# Patient Record
Sex: Female | Born: 1995 | Race: Black or African American | Hispanic: No | Marital: Single | Smoking: Never smoker
Health system: Southern US, Community
[De-identification: ages and names within clinical notes are randomized; demographics above are authoritative.]

## PROBLEM LIST (undated history)

## (undated) DIAGNOSIS — Z789 Other specified health status: Secondary | ICD-10-CM

## (undated) HISTORY — PX: NO PAST SURGERIES: SHX2092

## (undated) HISTORY — DX: Other specified health status: Z78.9

---
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
## 2018-04-27 ENCOUNTER — Other Ambulatory Visit: Payer: Self-pay

## 2018-04-27 ENCOUNTER — Ambulatory Visit (HOSPITAL_COMMUNITY)
Admission: EM | Admit: 2018-04-27 | Discharge: 2018-04-27 | Disposition: A | Discharge status: Home / Self Care | Attending: Family Medicine | Admitting: Family Medicine

## 2018-04-27 ENCOUNTER — Encounter (HOSPITAL_COMMUNITY): Payer: Self-pay | Admitting: Emergency Medicine

## 2018-04-27 DIAGNOSIS — K130 Diseases of lips: Secondary | ICD-10-CM

## 2018-04-27 DIAGNOSIS — Z113 Encounter for screening for infections with a predominantly sexual mode of transmission: Secondary | ICD-10-CM | POA: Insufficient documentation

## 2018-04-27 DIAGNOSIS — Z3202 Encounter for pregnancy test, result negative: Secondary | ICD-10-CM

## 2018-04-27 LAB — POCT URINALYSIS DIP (DEVICE)
GLUCOSE, UA: NEGATIVE mg/dL
Leukocytes, UA: NEGATIVE
Nitrite: NEGATIVE
PROTEIN: NEGATIVE mg/dL
Specific Gravity, Urine: 1.02 (ref 1.005–1.030)
UROBILINOGEN UA: 4 mg/dL — AB (ref 0.0–1.0)
pH: 7 (ref 5.0–8.0)

## 2018-04-27 LAB — POCT PREGNANCY, URINE: Preg Test, Ur: NEGATIVE

## 2018-04-27 NOTE — ED Triage Notes (Signed)
No abnormal discharge, no odor.  No pain with urination.  No abdominal pain, no back pain.

## 2018-04-27 NOTE — Discharge Instructions (Signed)
We are testing you for Herpes, HIV, Syphillis, Gonorrhea, Chlamydia, Trichomonas, Yeast and Bacterial Vaginosis. We will call you if anything is positive and let you know if you require any further treatment. Please inform partners of any positive results.   Please return if symptoms not improving with treatment, development of fever, nausea, vomiting, abdominal pain.

## 2018-04-28 LAB — CERVICOVAGINAL ANCILLARY ONLY
Bacterial vaginitis: POSITIVE — AB
Candida vaginitis: NEGATIVE
Chlamydia: NEGATIVE
Neisseria Gonorrhea: NEGATIVE
Trichomonas: NEGATIVE

## 2018-04-28 LAB — RPR: RPR Ser Ql: NONREACTIVE

## 2018-04-28 LAB — HIV ANTIBODY (ROUTINE TESTING W REFLEX): HIV Screen 4th Generation wRfx: NONREACTIVE

## 2018-04-28 NOTE — ED Provider Notes (Signed)
[facility]    CSN: [id number] Arrival date & time: 04/27/18  1715     History   Chief Complaint Chief Complaint  Patient presents with  . SEXUALLY TRANSMITTED DISEASE    HPI Jeanne Chapman is a 22 y.o. female no significant past medical history presenting today for STD screening.  Patient is concerned that she has noticed a bump on her upper lip that is been present for approximately 1 week.  She denies associated pain.  She is concerned that this may be herpes.  She denies other concerns, denies vaginal discharge, dysuria, increased frequency.  Denies abdominal pain, nausea or vomiting.  Denies lesions in genital area.  Denies known exposures to herpes.  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  Patient is sexually active.  Last menstrual period ended 10/31.  Patient is on oral contraceptives for birth control.  HPI  History reviewed. No pertinent past medical history.  There are no active problems to display for this patient.   History reviewed. No pertinent surgical history.  OB History   None      Home Medications    Prior to Admission medications   Medication Sig Start Date End Date Taking? Authorizing Provider  NON FORMULARY    Yes [provider]    Family History Family History  Problem Relation Age of Onset  . Hypertension Mother     Social History Social History   Tobacco Use  . Smoking status: Never Smoker  Substance Use Topics  . Alcohol use: Yes  . Drug use: Never     Allergies   Patient has no known allergies.   Review of Systems Review of Systems  Constitutional: Negative for fever.  HENT: Positive for mouth sores.   Respiratory: Negative for shortness of breath.   Cardiovascular: Negative for chest pain.  Gastrointestinal: Negative for abdominal pain, diarrhea, nausea and vomiting.  Genitourinary: Negative for dysuria, flank  pain, genital sores, hematuria, menstrual problem, vaginal bleeding, vaginal discharge and vaginal pain.  Musculoskeletal: Negative for back pain.  Skin: Negative for rash.  Neurological: Negative for dizziness, light-headedness and headaches.     Physical Exam Triage Vital Signs ED Triage Vitals  Enc Vitals Group     BP 04/27/18 1832 139/76     Pulse Rate 04/27/18 1832 97     Resp 04/27/18 1832 18     Temp 04/27/18 1832 98.8 F (37.1 C)     Temp Source 04/27/18 1832 Oral     SpO2 04/27/18 1832 100 %     Weight --      Height --      Head Circumference --      Peak Flow --      Pain Score 04/27/18 1830 0     Pain Loc --      Pain Edu? --      Excl. in GC? --    No data found.  Updated Vital Signs BP 139/76 (BP Location: Left Arm) Comment (BP Location): large cuff  Pulse 97   Temp 98.8 F (37.1 C) (Oral)   Resp 18   LMP 04/16/2018   SpO2 100%   Visual Acuity Right Eye Distance:   Left Eye Distance:   Bilateral Distance:    Right Eye Near:   Left Eye Near:    Bilateral Near:     Physical Exam  Constitutional: She appears  well-developed and well-nourished. No distress.  HENT:  Head: Normocephalic and atraumatic.  Small pimple-like lesion to central upper lip, nontender to palpation, slight erythema  Eyes: Conjunctivae are normal.  Neck: Neck supple.  Cardiovascular: Normal rate and regular rhythm.  No murmur heard. Pulmonary/Chest: Effort normal and breath sounds normal. No respiratory distress.  Abdominal: Soft. There is no tenderness.  Genitourinary:  Genitourinary Comments: Deferred  Musculoskeletal: She exhibits no edema.  Neurological: She is alert.  Skin: Skin is warm and dry.  Psychiatric: She has a normal mood and affect.  Nursing note and vitals reviewed.    UC Treatments / Results  Labs (all labs ordered are listed, but only abnormal results are displayed) Labs Reviewed  POCT URINALYSIS DIP (DEVICE) - Abnormal; Notable for the following  components:      Result Value   Bilirubin Urine SMALL (*)    Ketones, ur TRACE (*)    Hgb urine dipstick TRACE (*)    Urobilinogen, UA 4.0 (*)    All other components within normal limits  HSV CULTURE AND TYPING  HIV ANTIBODY (ROUTINE TESTING W REFLEX)  RPR  POCT PREGNANCY, URINE  CERVICOVAGINAL ANCILLARY ONLY    EKG None  Radiology No results found.  Procedures Procedures (including critical care time)  Medications Ordered in UC Medications - No data to display  Initial Impression / Assessment and Plan / UC Course  I have reviewed the triage vital signs and the nursing notes.  Pertinent labs & imaging results that were available during my care of the patient were reviewed by me and considered in my medical decision making (see chart for details).     Lesion on lip not concerning for herpes.  Discussed with this with patient, swab obtained to confirm.  Vaginal swab obtained as well to check for other STDs as well as HIV and syphilis.  Patient was emotional during the visit and all questions were answered.  Patient was attempted to console and reassure.  Will call patient with results and provide any treatment if needed. Final Clinical Impressions(s) / UC Diagnoses   Final diagnoses:  Screen for STD (sexually transmitted disease)     Discharge Instructions     We are testing you for Herpes, HIV, Syphillis, Gonorrhea, Chlamydia, Trichomonas, Yeast and Bacterial Vaginosis. We will call you if anything is positive and let you know if you require any further treatment. Please inform partners of any positive results.   Please return if symptoms not improving with treatment, development of fever, nausea, vomiting, abdominal pain.    ED Prescriptions    None     Controlled Substance Prescriptions [place] Controlled Substance Registry consulted? Not Applicable   Lew DawesWieters, Hallie C, [place]PA-C 04/28/18 1636

## 2018-04-30 ENCOUNTER — Telehealth (HOSPITAL_COMMUNITY): Payer: Self-pay

## 2018-04-30 MED ORDER — METRONIDAZOLE 500 MG PO TABS: 500.0000 mg | ORAL_TABLET | Freq: Two times a day (BID) | ORAL | 0 refills | Status: DC

## 2018-04-30 NOTE — Telephone Encounter (Signed)
Bacterial vaginosis is positive. This was not treated at the urgent care visit.  Flagyl 500 mg BID x 7 days #14 no refills sent to patients pharmacy of choice.  Patient contacted, denies any concerns. Answered all questions.    

## 2018-05-01 LAB — HSV CULTURE AND TYPING

## 2018-05-28 ENCOUNTER — Ambulatory Visit (INDEPENDENT_AMBULATORY_CARE_PROVIDER_SITE_OTHER): Payer: Self-pay | Admitting: Family Medicine

## 2018-05-28 ENCOUNTER — Encounter: Payer: Self-pay | Admitting: Family Medicine

## 2018-05-28 VITALS — BP 141/73 | HR 94 | Ht 65.0 in | Wt 252.0 lb

## 2018-05-28 DIAGNOSIS — N9089 Other specified noninflammatory disorders of vulva and perineum: Secondary | ICD-10-CM

## 2018-05-28 DIAGNOSIS — Z113 Encounter for screening for infections with a predominantly sexual mode of transmission: Secondary | ICD-10-CM

## 2018-05-28 DIAGNOSIS — N898 Other specified noninflammatory disorders of vagina: Secondary | ICD-10-CM

## 2018-05-28 NOTE — Patient Instructions (Signed)
   Sign up for MyChart  I will call you with your results  Use vaseline on labial lesion

## 2018-05-28 NOTE — Progress Notes (Signed)
   GYNECOLOGY OFFICE VISIT NOTE History:  22 y.o. G0P0000 here today for bleeding. States she went to wipe today and noticed small amount of blood on the tissue. States not from rectum area. Very small amount and not like period. Finished period somewhat recently. States had intercourse 04/16/18 with a new partner. Had not had intercourse prior for about 2 years. States thinks she had a small tear related to intercourse. Said she had been drinking and didn't plan on having intercourse but it just happened. Went to urgent care for testing about 1-2 weeks later and everything was negative. Has been feeling very stressed and upset about encounter. Is scared that she has contracted an STI. Was treated for BV and then yeast infection from urgent care. States she's never had either before. Does wax monthly.   Also wondering about bump on lips. Was previously tested for HSV at urgent care. Comes/goes.   The following portions of the patient's history were reviewed and updated as appropriate: allergies, current medications, past family history, past medical history, past social history, past surgical history and problem list.   Review of Systems:  Pertinent items noted in HPI ROS  Objective:  Physical Exam BP (!) 141/73   Pulse 94   Ht 5\' 5"  (1.651 m)   Wt 252 lb (114.3 kg)   LMP 05/18/2018 (Approximate)   BMI 41.93 kg/m  Physical Exam  Constitutional: She is oriented to person, place, and time. She appears well-developed and well-nourished.  tearful  HENT:  Head: Normocephalic and atraumatic.  Eyes: Conjunctivae and EOM are normal.  Cardiovascular: Normal rate.  Pulmonary/Chest: No respiratory distress.  Genitourinary:    Genitourinary Comments: External labia on left with small linear abrasion/ulceration (about 2-673mm in length)  normal appearing vagina with minimal white, thin discharge, normal appearing cervix  no CMT  rectum without hemorrhoids or fissure    Neurological: She is alert  and oriented to person, place, and time.  Skin: Skin is warm and dry.  Psychiatric: She has a normal mood and affect. Her behavior is normal.  Nursing note and vitals reviewed.  Labs and Imaging No results found for this or any previous visit (from the past 168 hour(s)). No results found.  Assessment & Plan:   Jeanne Reichmann22yo G0 who presents because of bleeding with wiping and concern for STI. No new partners since last intercourse 04/16/18. Was negative for testing nearly 2 weeks after that encounter. Will repeat testing today. Lesion on labia more consistent with small abrasion/tear that has not fully healed. Recommended application of vaseline and avoiding irritating tissue, excessive scrubbing. HSV culture collected. Repeat STI testing. Offered refill of OCPs but patient reports has at home. Counseled on vaginal hygiene. Reviewed return precautions and recommended setting up MyChart for correspondence. Reassurance provided.   Cristal DeerLaurel S. Earlene PlaterWallace, DO OB Family Medicine Fellow, [facility], [facility]

## 2018-05-29 LAB — RPR: RPR: NONREACTIVE

## 2018-05-29 LAB — HIV ANTIBODY (ROUTINE TESTING W REFLEX): HIV Screen 4th Generation wRfx: NONREACTIVE

## 2018-05-31 LAB — HERPES SIMPLEX VIRUS CULTURE

## 2018-06-02 LAB — CERVICOVAGINAL ANCILLARY ONLY
BACTERIAL VAGINITIS: NEGATIVE
Candida vaginitis: NEGATIVE
Chlamydia: NEGATIVE
Neisseria Gonorrhea: NEGATIVE
Trichomonas: NEGATIVE

## 2018-06-25 ENCOUNTER — Ambulatory Visit (INDEPENDENT_AMBULATORY_CARE_PROVIDER_SITE_OTHER): Payer: Self-pay | Admitting: Family Medicine

## 2018-06-25 ENCOUNTER — Encounter: Payer: Self-pay | Admitting: Family Medicine

## 2018-06-25 ENCOUNTER — Telehealth: Payer: Self-pay | Admitting: Family Medicine

## 2018-06-25 VITALS — BP 142/78 | HR 91 | Ht 65.0 in | Wt 252.9 lb

## 2018-06-25 DIAGNOSIS — K130 Diseases of lips: Secondary | ICD-10-CM

## 2018-06-25 NOTE — Telephone Encounter (Signed)
Called to make an appointment. Requested to be seen urgently due to issue. Stated she would like to continue to see Earlene Plater, APP going forward.

## 2018-06-25 NOTE — Progress Notes (Signed)
   Subjective:    Jeanne Chapman - 23 y.o. female MRN [id number]  Date of birth: 1996-01-04  HPI  Jeanne Chapman is a 23 y.o. G0P0000 female here for bump on upper lip. She states that the bump started in November. It has not gone away completely and come back since then. It does shrink smaller and get bigger, but she's not sure it has a specific pattern. Was seen for this previously, and had HSV testing on it, which she states was negative. Concerned that something is wrong, so she would like it removed.  OB History    Gravida  0   Para  0   Term  0   Preterm  0   AB  0   Living  0     SAB  0   TAB  0   Ectopic  0   Multiple  0   Live Births  0           Health Maintenance:   Health Maintenance Due  Topic Date Due  . TETANUS/TDAP  10/01/2016  . INFLUENZA VACCINE  01/15/2018    -  reports that she has never smoked. She has never used smokeless tobacco. - Review of Systems: Per HPI. - Past Medical History: Patient Active Problem List   Diagnosis Date Noted  . Dysmenorrhea in the adolescent 03/23/2013  . Insulin resistance 03/23/2013  . Obesity 03/23/2013   - Medications: reviewed and updated   Objective:   Physical Exam BP (!) 142/78   Pulse 91   Ht 5\' 5"  (1.651 m)   Wt 252 lb 14.4 oz (114.7 kg)   LMP 05/31/2018 (Within Days)   BMI 42.08 kg/m  Gen: tearful, alert, cooperative with exam, well-appearing HEENT: NCAT, PERRL, clear conjunctiva, 57mm diameter nodule on center of upper lip, no erythema, drainage, warmth or tenderness Resp: non-labored Skin: no rashes, normal turgor  Neuro: no gross deficits.  Psych: good insight, alert and oriented    Assessment & Plan:   1. Lesion of lip - benign appearing, however pt is anxious that something terrible is wrong - reassurance provided - Ambulatory referral to Dermatology for eval and treat   Routine preventative health maintenance measures emphasized. Please refer to After Visit Summary for other  counseling recommendations.   Return if symptoms worsen or fail to improve.  Gwenevere Abbot, MD  OB Fellow  06/25/2018, 8:07 PM

## 2020-09-03 ENCOUNTER — Encounter (HOSPITAL_BASED_OUTPATIENT_CLINIC_OR_DEPARTMENT_OTHER): Payer: Self-pay | Admitting: Emergency Medicine

## 2020-09-03 ENCOUNTER — Emergency Department (HOSPITAL_BASED_OUTPATIENT_CLINIC_OR_DEPARTMENT_OTHER)
Admission: EM | Admit: 2020-09-03 | Discharge: 2020-09-03 | Disposition: A | Discharge status: Home / Self Care | Attending: Emergency Medicine | Admitting: Emergency Medicine

## 2020-09-03 ENCOUNTER — Other Ambulatory Visit: Payer: Self-pay

## 2020-09-03 ENCOUNTER — Emergency Department (HOSPITAL_BASED_OUTPATIENT_CLINIC_OR_DEPARTMENT_OTHER)

## 2020-09-03 DIAGNOSIS — G43109 Migraine with aura, not intractable, without status migrainosus: Secondary | ICD-10-CM | POA: Diagnosis not present

## 2020-09-03 DIAGNOSIS — Z8669 Personal history of other diseases of the nervous system and sense organs: Secondary | ICD-10-CM | POA: Diagnosis not present

## 2020-09-03 DIAGNOSIS — R202 Paresthesia of skin: Secondary | ICD-10-CM | POA: Diagnosis not present

## 2020-09-03 DIAGNOSIS — R519 Headache, unspecified: Secondary | ICD-10-CM | POA: Diagnosis present

## 2020-09-03 LAB — BASIC METABOLIC PANEL
Anion gap: 13 (ref 5–15)
BUN: <5 mg/dL — ABNORMAL LOW (ref 6–20)
CO2: 20 mmol/L — ABNORMAL LOW (ref 22–32)
Calcium: 8.9 mg/dL (ref 8.9–10.3)
Chloride: 103 mmol/L (ref 98–111)
Creatinine, Ser: 0.68 mg/dL (ref 0.44–1.00)
GFR, Estimated: >60 mL/min (ref >60)
Glucose, Bld: 155 mg/dL — ABNORMAL HIGH (ref 70–99)
Potassium: 3.5 mmol/L (ref 3.5–5.1)
Sodium: 136 mmol/L (ref 135–145)

## 2020-09-03 LAB — CBC WITH DIFFERENTIAL/PLATELET
Abs Immature Granulocytes: 0.03 10*3/uL (ref 0.00–0.07)
Basophils Absolute: 0 10*3/uL (ref 0.0–0.1)
Basophils Relative: 0 %
Eosinophils Absolute: 0 10*3/uL (ref 0.0–0.5)
Eosinophils Relative: 0 %
HCT: 39 % (ref 36.0–46.0)
Hemoglobin: 12.9 g/dL (ref 12.0–15.0)
Immature Granulocytes: 0 %
Lymphocytes Relative: 17 %
Lymphs Abs: 1.6 10*3/uL (ref 0.7–4.0)
MCH: 26.8 pg (ref 26.0–34.0)
MCHC: 33.1 g/dL (ref 30.0–36.0)
MCV: 81.1 fL (ref 80.0–100.0)
Monocytes Absolute: 0.6 10*3/uL (ref 0.1–1.0)
Monocytes Relative: 6 %
Neutro Abs: 7.3 10*3/uL (ref 1.7–7.7)
Neutrophils Relative %: 77 %
Platelets: 306 10*3/uL (ref 150–400)
RBC: 4.81 MIL/uL (ref 3.87–5.11)
RDW: 13.1 % (ref 11.5–15.5)
WBC: 9.5 10*3/uL (ref 4.0–10.5)
nRBC: 0 % (ref 0.0–0.2)

## 2020-09-03 LAB — PREGNANCY, URINE: Preg Test, Ur: NEGATIVE

## 2020-09-03 MED ORDER — METOCLOPRAMIDE HCL 5 MG/ML IJ SOLN
10.0000 mg | Freq: Once | INTRAMUSCULAR | Status: AC
  Administered 2020-09-03: 10 mg via INTRAVENOUS
  Filled 2020-09-03: qty 2

## 2020-09-03 MED ORDER — SODIUM CHLORIDE 0.9 % IV BOLUS
1000.0000 mL | Freq: Once | INTRAVENOUS | Status: AC
  Administered 2020-09-03: 1000 mL via INTRAVENOUS

## 2020-09-03 MED ORDER — DROPERIDOL 2.5 MG/ML IJ SOLN
1.2500 mg | Freq: Once | INTRAMUSCULAR | Status: AC
  Administered 2020-09-03: 1.25 mg via INTRAVENOUS
  Filled 2020-09-03: qty 2

## 2020-09-03 MED ORDER — DEXAMETHASONE SODIUM PHOSPHATE 10 MG/ML IJ SOLN
10.0000 mg | Freq: Once | INTRAMUSCULAR | Status: AC
  Administered 2020-09-03: 10 mg via INTRAVENOUS
  Filled 2020-09-03: qty 1

## 2020-09-03 MED ORDER — KETOROLAC TROMETHAMINE 15 MG/ML IJ SOLN
15.0000 mg | Freq: Once | INTRAMUSCULAR | Status: AC
  Administered 2020-09-03: 15 mg via INTRAVENOUS
  Filled 2020-09-03: qty 1

## 2020-09-03 NOTE — ED Notes (Signed)
Patient transported to CT 

## 2020-09-03 NOTE — ED Notes (Signed)
Arrived via [facility] with c/o migraine h/a. Vomited x 2

## 2020-09-03 NOTE — ED Triage Notes (Signed)
Pt arrives via EMS with c/o HA and N/V that started at 10 today. Pt endorses photosensitivity. AOx4, bilaterally equal. Pt took "liquid pain relief" at 11 today with no relief

## 2020-09-03 NOTE — ED Provider Notes (Signed)
[facility] EMERGENCY DEPARTMENT Provider Note   CSN: [id number] Arrival date & time: 09/03/20  1653     History Chief Complaint  Patient presents with  . Headache    Mareena Cavan is a 25 y.o. female.  The history is provided by the patient and medical records.  Headache  Allis Quirarte is a 25 y.o. female who presents to the Emergency Department complaining of HA. She presents the emergency department by EMS for evaluation of HA that started at 9am this morning when she woke up.  Pain is located throughout the left head.  She has associated numbness to right face, arm. She describes this is a migraine headache and does have a history of migraines. She has not had a migraine since last year. She states that symptoms are similar to prior migraines but more intense than prior headaches. She took Tylenol at home with no significant improvement in symptoms. She denies any fevers. She does have vomiting. No recent illnesses or injuries. She takes Lexapro. No additional medical problems.    Past Medical History:  Diagnosis Date  . Medical history non-contributory     Patient Active Problem List   Diagnosis Date Noted  . Dysmenorrhea in the adolescent 03/23/2013  . Insulin resistance 03/23/2013  . Obesity 03/23/2013    Past Surgical History:  Procedure Laterality Date  . NO PAST SURGERIES       OB History    Gravida  0   Para  0   Term  0   Preterm  0   AB  0   Living  0     SAB  0   IAB  0   Ectopic  0   Multiple  0   Live Births  0           Family History  Problem Relation Age of Onset  . Hypertension Mother     Social History   Tobacco Use  . Smoking status: Never Smoker  . Smokeless tobacco: Never Used  Vaping Use  . Vaping Use: Never used  Substance Use Topics  . Alcohol use: Not Currently  . Drug use: Never    Home Medications Prior to Admission medications   Medication Sig Start Date End Date Taking? Authorizing  Provider  NON FORMULARY     [provider]    Allergies    Patient has no known allergies.  Review of Systems   Review of Systems  Neurological: Positive for headaches.  All other systems reviewed and are negative.   Physical Exam Updated Vital Signs BP 103/64 (BP Location: Left Arm)   Pulse 73   Temp 97.8 F (36.6 C) (Oral)   Resp 18   Ht 5\' 4"  (1.626 m)   Wt 81.6 kg   LMP 08/30/2020   SpO2 100%   BMI 30.90 kg/m   Physical Exam Vitals and nursing note reviewed.  Constitutional:      Appearance: She is well-developed.  HENT:     Head: Normocephalic and atraumatic.  Cardiovascular:     Rate and Rhythm: Normal rate and regular rhythm.     Heart sounds: No murmur heard.   Pulmonary:     Effort: Pulmonary effort is normal. No respiratory distress.     Breath sounds: Normal breath sounds.  Abdominal:     Palpations: Abdomen is soft.     Tenderness: There is no abdominal tenderness. There is no guarding or rebound.  Musculoskeletal:  General: No tenderness.  Skin:    General: Skin is warm and dry.  Neurological:     Mental Status: She is alert and oriented to person, place, and time.     Comments: No asymmetry of facial movements. Sensation light touch intact throughout the face, bilateral upper extremities and bilateral lower extremities. Five out of five strength in all four extremities.  Psychiatric:        Behavior: Behavior normal.     ED Results / Procedures / Treatments   Labs (all labs ordered are listed, but only abnormal results are displayed) Labs Reviewed  BASIC METABOLIC PANEL - Abnormal; Notable for the following components:      Result Value   CO2 20 (*)    Glucose, Bld 155 (*)    BUN <5 (*)    All other components within normal limits  CBC WITH DIFFERENTIAL/PLATELET  PREGNANCY, URINE    EKG None  Radiology CT Head Wo Contrast  Result Date: 09/03/2020 CLINICAL DATA:  Pt arrives via EMS with c/o HA and N/V that  started at 10 today. Pt endorses photosensitivity. AOx4, bilaterally equal. Pt took "liquid pain relief" at 11 today with no relief EXAM: CT HEAD WITHOUT CONTRAST TECHNIQUE: Contiguous axial images were obtained from the base of the skull through the vertex without intravenous contrast. COMPARISON:  None. FINDINGS: Brain: No evidence of acute infarction, hemorrhage, hydrocephalus, extra-axial collection or mass lesion/mass effect. Vascular: No hyperdense vessel or unexpected calcification. Skull: Normal. Negative for fracture or focal lesion. Sinuses/Orbits: No acute finding. Other: None. IMPRESSION: No acute intracranial pathology. Electronically Signed   By: Emmaline Kluver M.D.   On: 09/03/2020 18:10    Procedures Procedures   Medications Ordered in ED Medications  sodium chloride 0.9 % bolus 1,000 mL (0 mLs Intravenous Stopped 09/03/20 1943)  metoCLOPramide (REGLAN) injection 10 mg (10 mg Intravenous Given 09/03/20 1747)  ketorolac (TORADOL) 15 MG/ML injection 15 mg (15 mg Intravenous Given 09/03/20 1946)  dexamethasone (DECADRON) injection 10 mg (10 mg Intravenous Given 09/03/20 1946)  droperidol (INAPSINE) 2.5 MG/ML injection 1.25 mg (1.25 mg Intravenous Given 09/03/20 2034)    ED Course  I have reviewed the triage vital signs and the nursing notes.  Pertinent labs & imaging results that were available during my care of the patient were reviewed by me and considered in my medical decision making (see chart for details).    MDM Rules/Calculators/A&P                         Patient with history of migraine headache here for evaluation of headache similar but more intense than prior headaches. She reports paresthesias to the face and arm. On examination there are no focal deficits. Imaging is negative for acute abnormality. Presentation is not consistent with aneurysm, subarachnoid hemorrhage, CVA, meningitis, dural sinus thrombosis. Following treatment in the emergency department her  paresthesias resolved and she is feeling improved. Discussed home care for migraine. Recommend PCP follow-up.  Final Clinical Impression(s) / ED Diagnoses Final diagnoses:  Complicated migraine    Rx / DC Orders ED Discharge Orders    None       Tilden Fossa, MD 09/03/20 2206

## 2022-07-27 IMAGING — CT CT HEAD W/O CM
3 series · 16 of 47 positions shown, 19 images · non-contrast
Comparison: None.

CLINICAL DATA: Pt arrives via EMS with c/o HA and N/V that started
at 10 today. Pt endorses photosensitivity. AOx4, bilaterally equal.
Pt took "liquid pain relief" at 11 today with no relief

EXAM:
CT HEAD WITHOUT CONTRAST
TECHNIQUE: Contiguous axial images were obtained from the base of the skull
through the vertex without intravenous contrast.

[Series 2: head wo · axial · 0.44mm/px · z∈[+686,+812]mm · 10 of 31 slices shown, 13 images]
[im 3/31  brain]
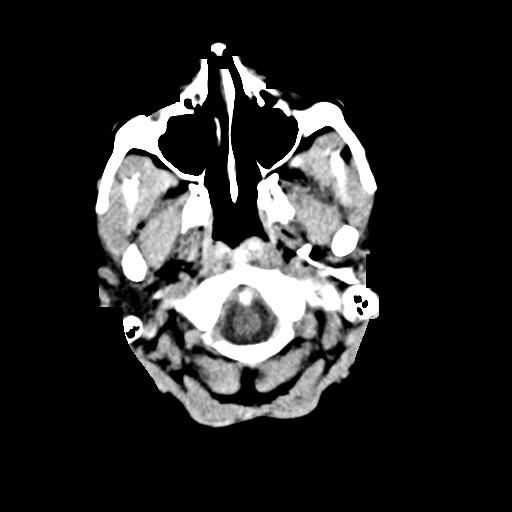
[im 3/31  bone]
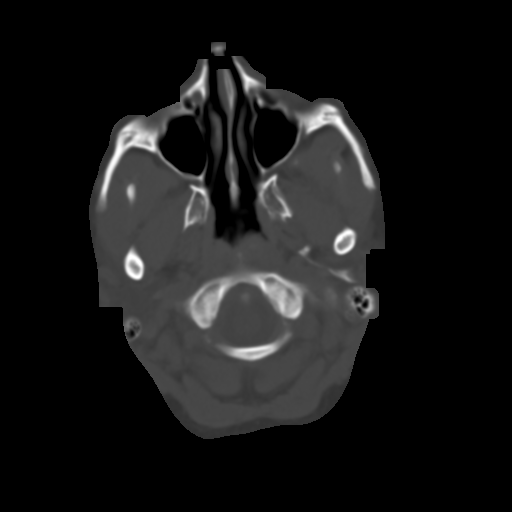
[im 6/31  brain]
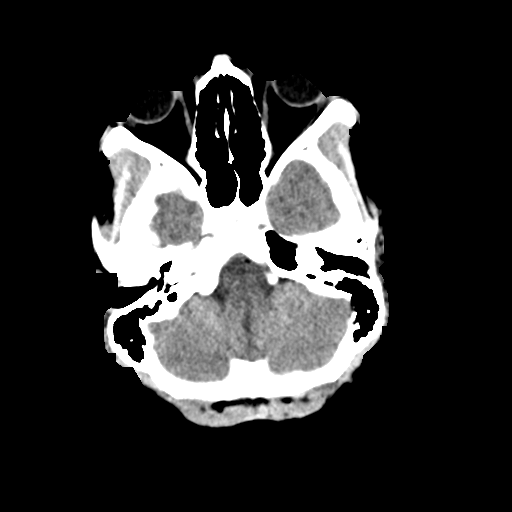
[im 9/31  brain]
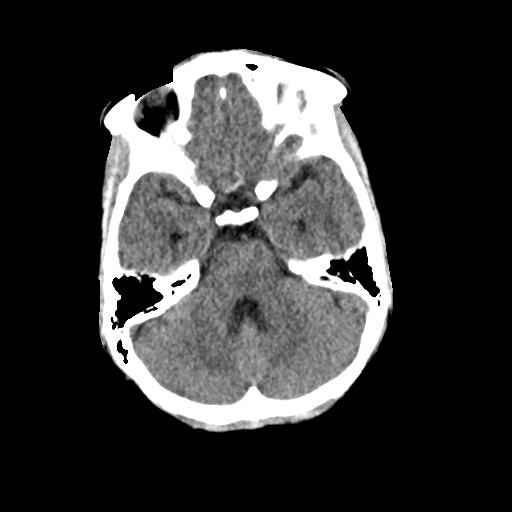
[im 11/31  brain]
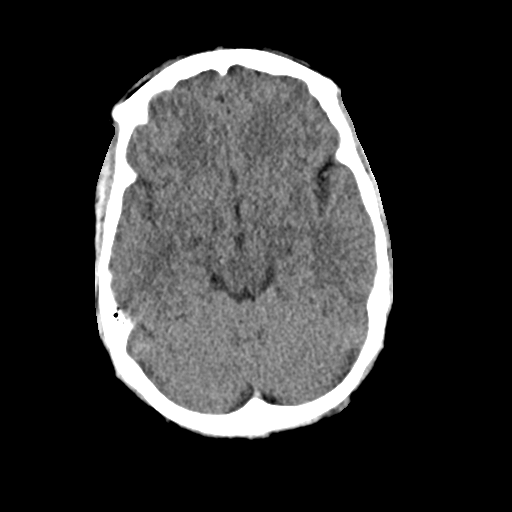
[im 14/31  brain]
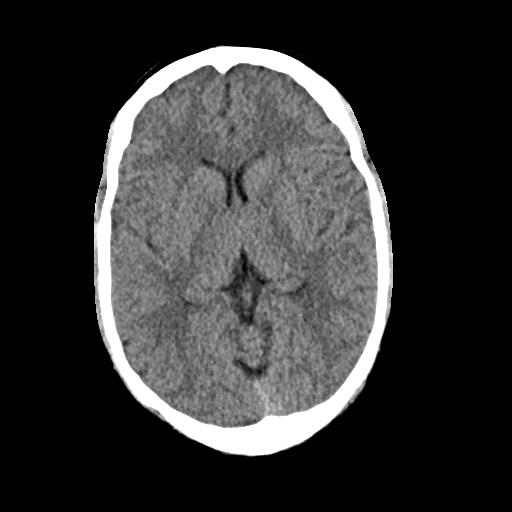
[im 14/31  bone]
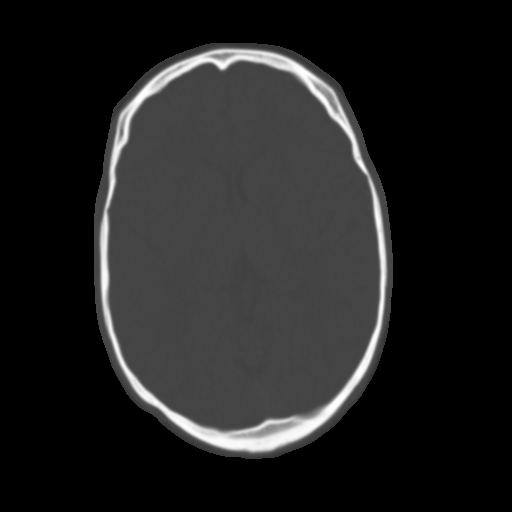
[im 17/31  brain]
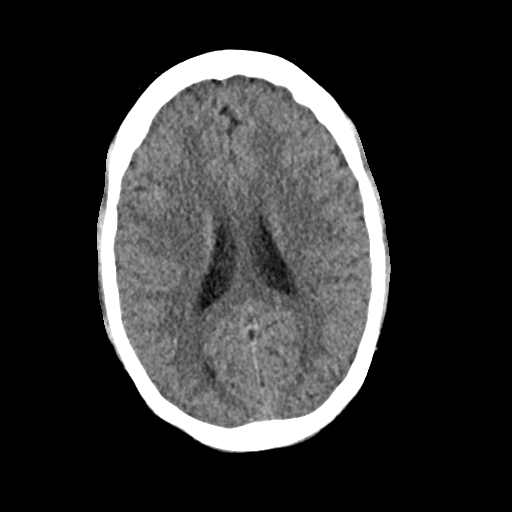
[im 20/31  brain]
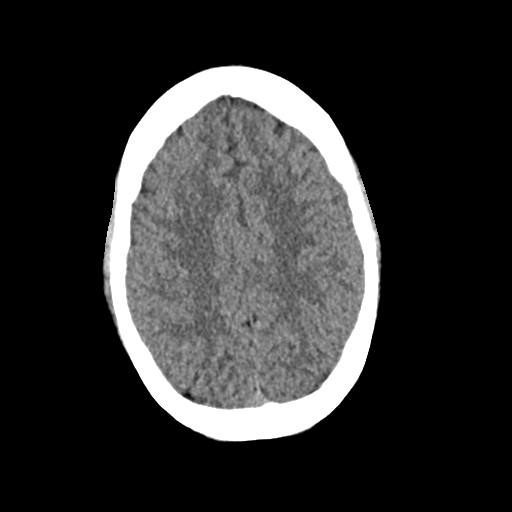
[im 23/31  brain]
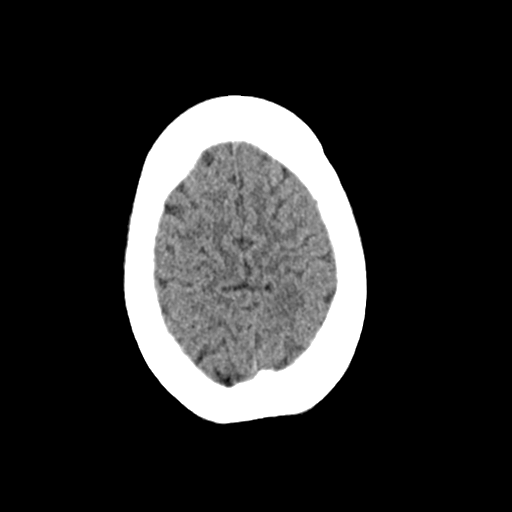
[im 25/31  brain]
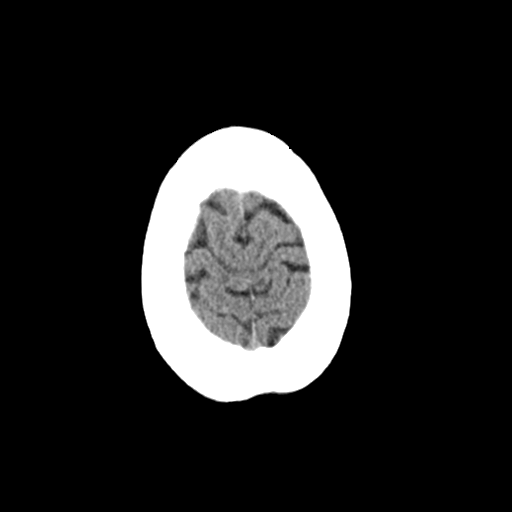
[im 25/31  bone]
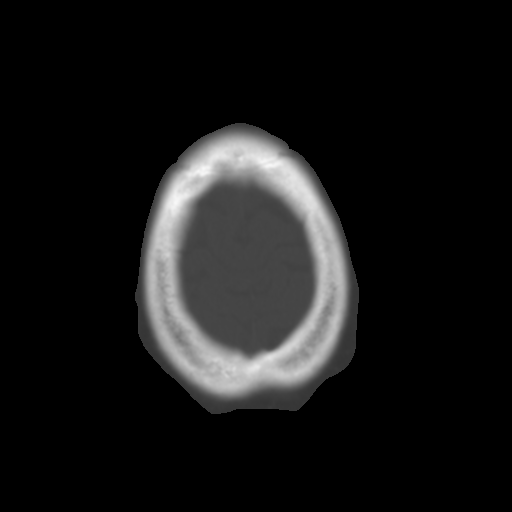
[im 28/31  brain]
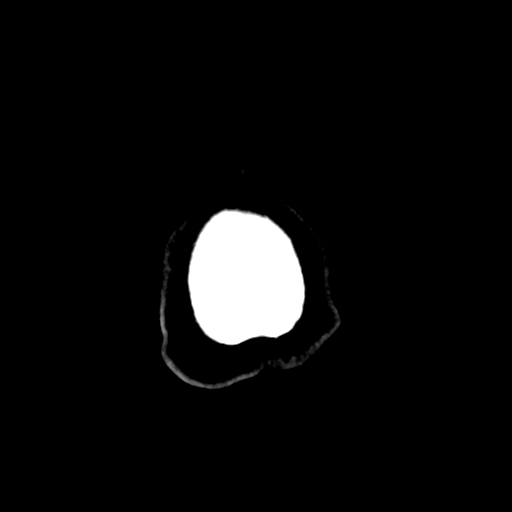

[Series 4: coronal soft · coronal · 0.33mm/px · 3 of 70 slices shown]
[im 24/70  brain]
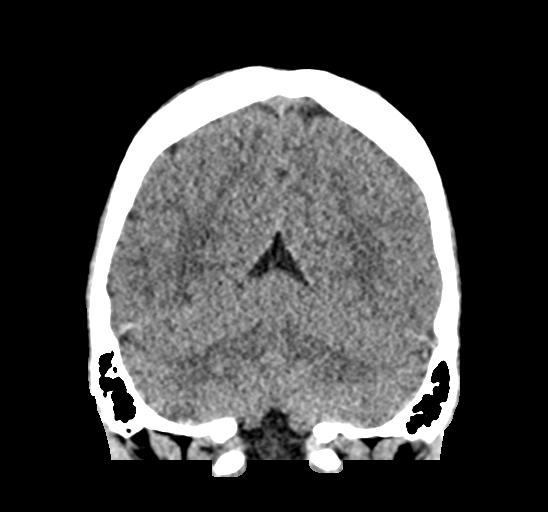
[im 31/70  brain]
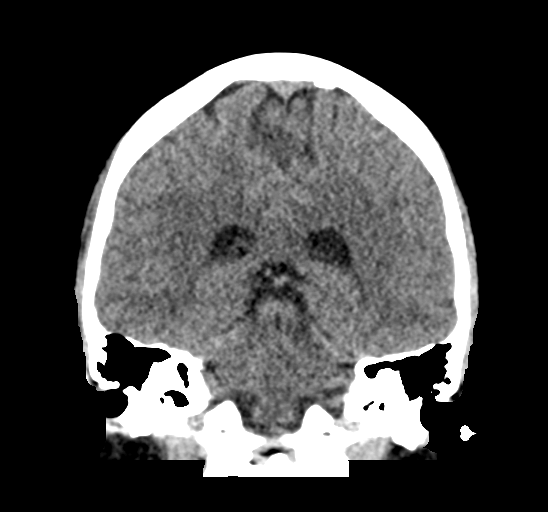
[im 39/70  brain]
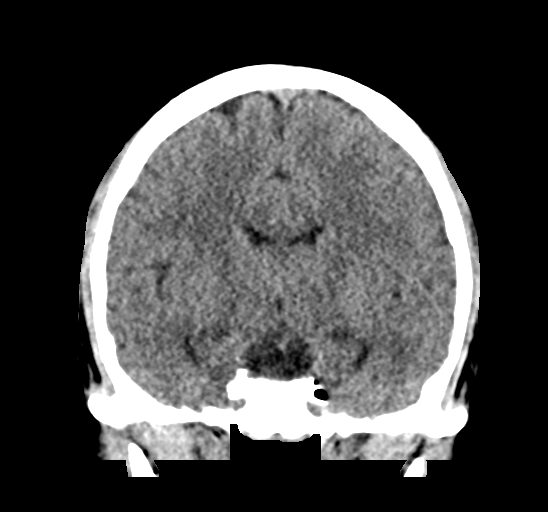

[Series 5: sag soft · sagittal · 0.32mm/px · 3 of 57 slices shown]
[im 19/57  brain]
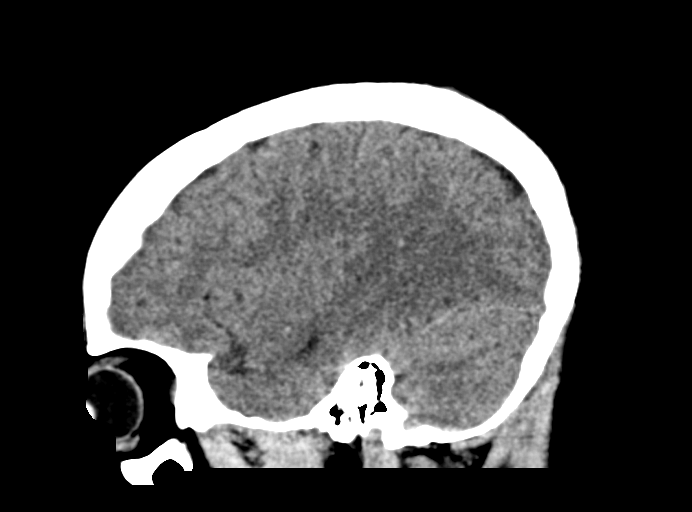
[im 29/57  brain]
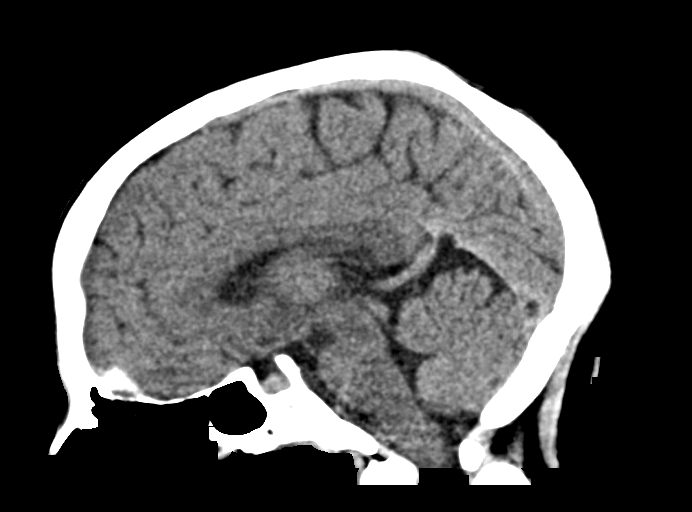
[im 38/57  brain]
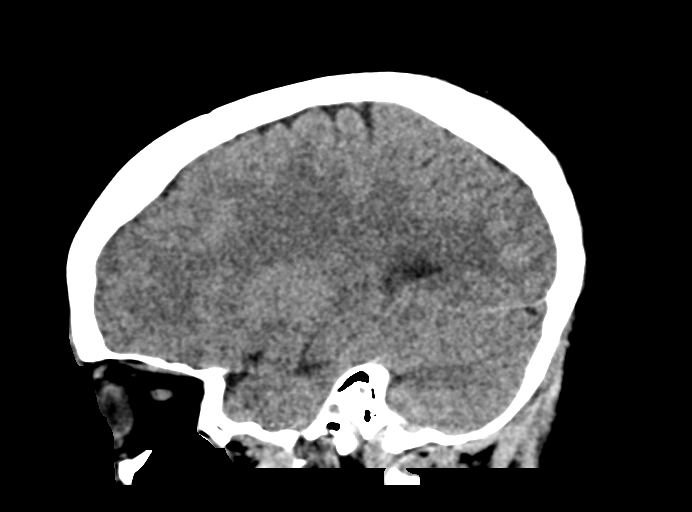

[16 of 47 positions shown; findings below may reference images not displayed]

FINDINGS: Brain: No evidence of acute infarction, hemorrhage, hydrocephalus,
extra-axial collection or mass lesion/mass effect.

Vascular: No hyperdense vessel or unexpected calcification.

Skull: Normal. Negative for fracture or focal lesion.

Sinuses/Orbits: No acute finding.

Other: None.
IMPRESSION: No acute intracranial pathology.
